# Patient Record
Sex: Male | Born: 1935 | Race: White | Hispanic: No | Marital: Married | Smoking: Former smoker
Health system: Southern US, Community
[De-identification: ages and names within clinical notes are randomized; demographics above are authoritative.]

## PROBLEM LIST (undated history)

## (undated) DIAGNOSIS — K449 Diaphragmatic hernia without obstruction or gangrene: Secondary | ICD-10-CM

## (undated) DIAGNOSIS — I10 Essential (primary) hypertension: Secondary | ICD-10-CM

## (undated) HISTORY — PX: APPENDECTOMY: SHX54

## (undated) HISTORY — PX: HERNIA REPAIR: SHX51

---

## 2016-11-05 ENCOUNTER — Emergency Department (HOSPITAL_COMMUNITY)
Admission: EM | Admit: 2016-11-05 | Discharge: 2016-11-05 | Disposition: A | Discharge status: Home / Self Care | Payer: Medicare HMO | Attending: Emergency Medicine | Admitting: Emergency Medicine

## 2016-11-05 ENCOUNTER — Encounter (HOSPITAL_COMMUNITY): Payer: Self-pay

## 2016-11-05 ENCOUNTER — Emergency Department (HOSPITAL_COMMUNITY): Payer: Medicare HMO

## 2016-11-05 DIAGNOSIS — S065X0D Traumatic subdural hemorrhage without loss of consciousness, subsequent encounter: Secondary | ICD-10-CM | POA: Insufficient documentation

## 2016-11-05 DIAGNOSIS — Z87891 Personal history of nicotine dependence: Secondary | ICD-10-CM | POA: Insufficient documentation

## 2016-11-05 DIAGNOSIS — R7989 Other specified abnormal findings of blood chemistry: Secondary | ICD-10-CM

## 2016-11-05 DIAGNOSIS — R519 Headache, unspecified: Secondary | ICD-10-CM

## 2016-11-05 DIAGNOSIS — R110 Nausea: Secondary | ICD-10-CM | POA: Diagnosis not present

## 2016-11-05 DIAGNOSIS — S065X9A Traumatic subdural hemorrhage with loss of consciousness of unspecified duration, initial encounter: Secondary | ICD-10-CM

## 2016-11-05 DIAGNOSIS — I10 Essential (primary) hypertension: Secondary | ICD-10-CM | POA: Insufficient documentation

## 2016-11-05 DIAGNOSIS — R51 Headache: Secondary | ICD-10-CM | POA: Insufficient documentation

## 2016-11-05 DIAGNOSIS — W19XXXD Unspecified fall, subsequent encounter: Secondary | ICD-10-CM | POA: Insufficient documentation

## 2016-11-05 DIAGNOSIS — S065XAA Traumatic subdural hemorrhage with loss of consciousness status unknown, initial encounter: Secondary | ICD-10-CM

## 2016-11-05 HISTORY — DX: Diaphragmatic hernia without obstruction or gangrene: K44.9

## 2016-11-05 HISTORY — DX: Essential (primary) hypertension: I10

## 2016-11-05 LAB — CBC
HEMATOCRIT: 41.3 % (ref 39.0–52.0)
HEMOGLOBIN: 13.6 g/dL (ref 13.0–17.0)
MCH: 29.7 pg (ref 26.0–34.0)
MCHC: 32.9 g/dL (ref 30.0–36.0)
MCV: 90.2 fL (ref 78.0–100.0)
Platelets: 191 10*3/uL (ref 150–400)
RBC: 4.58 MIL/uL (ref 4.22–5.81)
RDW: 13.5 % (ref 11.5–15.5)
WBC: 9 10*3/uL (ref 4.0–10.5)

## 2016-11-05 LAB — BASIC METABOLIC PANEL
Anion gap: 9 (ref 5–15)
BUN: 17 mg/dL (ref 6–20)
CALCIUM: 8.6 mg/dL — AB (ref 8.9–10.3)
CO2: 24 mmol/L (ref 22–32)
CREATININE: 1.59 mg/dL — AB (ref 0.61–1.24)
Chloride: 103 mmol/L (ref 101–111)
GFR calc Af Amer: 46 mL/min — ABNORMAL LOW (ref >60)
GFR calc non Af Amer: 40 mL/min — ABNORMAL LOW (ref >60)
Glucose, Bld: 158 mg/dL — ABNORMAL HIGH (ref 65–99)
Potassium: 3.8 mmol/L (ref 3.5–5.1)
SODIUM: 136 mmol/L (ref 135–145)

## 2016-11-05 MED ORDER — KETOROLAC TROMETHAMINE 30 MG/ML IJ SOLN: 15.0000 mg | Freq: Once | INTRAMUSCULAR | Status: DC

## 2016-11-05 MED ORDER — HYDROMORPHONE HCL 1 MG/ML IJ SOLN
1.0000 mg | Freq: Once | INTRAMUSCULAR | Status: AC
  Administered 2016-11-05: 1 mg via INTRAMUSCULAR
  Filled 2016-11-05: qty 1

## 2016-11-05 MED ORDER — PROMETHAZINE HCL 25 MG/ML IJ SOLN
12.5000 mg | Freq: Once | INTRAMUSCULAR | Status: DC
  Filled 2016-11-05: qty 1

## 2016-11-05 MED ORDER — ACETAMINOPHEN 325 MG PO TABS
650.0000 mg | ORAL_TABLET | Freq: Once | ORAL | Status: AC
  Administered 2016-11-05: 650 mg via ORAL
  Filled 2016-11-05: qty 2

## 2016-11-05 MED ORDER — ONDANSETRON HCL 4 MG PO TABS
8.0000 mg | ORAL_TABLET | Freq: Once | ORAL | Status: AC
  Administered 2016-11-05: 8 mg via ORAL
  Filled 2016-11-05: qty 2

## 2016-11-05 MED ORDER — MORPHINE SULFATE (PF) 4 MG/ML IV SOLN
2.0000 mg | Freq: Once | INTRAVENOUS | Status: DC
  Filled 2016-11-05: qty 1

## 2016-11-05 MED ORDER — PROMETHAZINE HCL 25 MG PO TABS
25.0000 mg | ORAL_TABLET | Freq: Once | ORAL | Status: AC
  Administered 2016-11-05: 25 mg via ORAL
  Filled 2016-11-05: qty 1

## 2016-11-05 NOTE — Discharge Instructions (Signed)
Do not take any more of Vicodin tonight.  The Vicodin may be causing your severe nausea Continue Zofran as needed for nausea Drink plenty of fluids. Your creatinine is elevated and will need to be rechecked this week. [facility]

## 2016-11-05 NOTE — ED Triage Notes (Signed)
Pt arrived via [facility] from home with complaints of HA since falling Wednesday. Pt was diagnosed with bleed and sent to [facility]. Pt was d/c yesterday with oxycodone, Zofran, and Keppra . Pt reports he is still nauseated and having a headache.

## 2016-11-05 NOTE — ED Notes (Signed)
Patient denies pain and is resting comfortably.  

## 2016-11-05 NOTE — ED Provider Notes (Signed)
[facility] EMERGENCY DEPARTMENT Provider Note   CSN: [id number] Arrival date & time: 11/05/16  1837     History   Chief Complaint Chief Complaint  Patient presents with  . Headache    HPI Kurt Morales is a 81 y.o. male.  HPI  81 year old man who fell on Wednesday while working.  He struck his head.  He was seen at [facility] and [facility].  He was discharged yesterday.  He has continued to have headache and nausea since discharge.  He is taking Percocet, Zofran, and Keppra.  He denies any increased weakness, problems with balance, with vision, or with speech.  He also complains of some neck pain but no numbness or tingling  Past Medical History:  Diagnosis Date  . Hiatal hernia   . Hypertension     There are no active problems to display for this patient.   Past Surgical History:  Procedure Laterality Date  . APPENDECTOMY     when he was 15   . HERNIA REPAIR         Home Medications    Prior to Admission medications   Not on File    Family History No family history on file.  Social History Social History  Substance Use Topics  . Smoking status: Former Games developer  . Smokeless tobacco: Never Used  . Alcohol use No     Allergies   Patient has no allergy information on record.   Review of Systems Review of Systems  All other systems reviewed and are negative.    Physical Exam Updated Vital Signs BP (!) 142/70   Pulse 69   Temp 98.2 F (36.8 C) (Oral)   Resp 12   Ht 1.727 m (5\' 8" )   Wt 74.8 kg (165 lb)   SpO2 93%   BMI 25.09 kg/m   Physical Exam  Constitutional: He is oriented to person, place, and time. He appears well-developed.  HENT:  Head: Normocephalic and atraumatic.  Right Ear: External ear normal.  Left Ear: External ear normal.  Nose: Nose normal.  Contusion noted occiput  Eyes: EOM are normal.  Neck: Normal range of motion. Neck supple. No tracheal  deviation present.  Cardiovascular: Normal rate, regular rhythm and normal heart sounds.   Pulmonary/Chest: Effort normal and breath sounds normal.  Abdominal: Soft. Bowel sounds are normal.  Musculoskeletal: Normal range of motion.  Neurological: He is alert and oriented to person, place, and time. He displays normal reflexes. No cranial nerve deficit or sensory deficit. He exhibits normal muscle tone. Coordination normal.  Skin: Skin is warm and dry.  Psychiatric: He has a normal mood and affect. His behavior is normal.  Nursing note and vitals reviewed.    ED Treatments / Results  Labs (all labs ordered are listed, but only abnormal results are displayed) Labs Reviewed  BASIC METABOLIC PANEL - Abnormal; Notable for the following:       Result Value   Glucose, Bld 158 (*)    Creatinine, Ser 1.59 (*)    Calcium 8.6 (*)    GFR calc non Af Amer 40 (*)    GFR calc Af Amer 46 (*)    All other components within normal limits  CBC    EKG  EKG Interpretation None       Radiology Ct Head Wo Contrast  Result Date: 11/05/2016 CLINICAL DATA:  Headache since falling on Wednesday when patient had intracranial hemorrhage. Discharge from [facility]  yesterday. Nausea and headache. EXAM: CT HEAD WITHOUT CONTRAST CT CERVICAL SPINE WITHOUT CONTRAST TECHNIQUE: Multidetector CT imaging of the head and cervical spine was performed following the standard protocol without intravenous contrast. Multiplanar CT image reconstructions of the cervical spine were also generated. COMPARISON:  Head [facility] 09/02/2016 FINDINGS: CT HEAD FINDINGS Brain: Bilateral inferior frontal hemorrhagic contusions. As expected, edema increased from comparison study but hemorrhage is nonprogressive. Bilateral subdural hematomas along the frontal convexities, stable to decreased from prior. Left-sided subdural hematoma measures up to 5 mm. No subdural mass-effect. Possible subarachnoid hemorrhage  along the mid falx, stable. Small volume anterior left frontal subarachnoid hemorrhage was also seen previously. Small volume right posterior frontal subarachnoid hemorrhage that is newly seen. Probable contrecoup nonhemorrhagic contusion in the left occipital pole. No infarct, hydrocephalus, or shift. Vascular: No acute finding Skull: Known frontal bone fracture and diastatic upper sagittal suture. No depressed fracture. Sinuses/Orbits: No acute finding.  Bilateral cataract resection. CT CERVICAL SPINE FINDINGS Alignment: No traumatic malalignment. Skull base and vertebrae: Negative for fracture Soft tissues and spinal canal: No prevertebral fluid or swelling. No visible canal hematoma. Disc levels:  No evidence of cord impingement Upper chest: No acute finding IMPRESSION: 1. Known bilateral inferior frontal hemorrhagic contusion. As expected, swelling is increased from 11/02/2016, but no progressive hemorrhage. 2. Bifrontal subdural hematoma that is stable to decreased. No associated mass effect. 3. Interval trace subarachnoid hemorrhage along the right parietal convexity. If no interval injury this may reflect re- distributed blood. 4. Suspect mild nonhemorrhagic contrecoup contusion in the left occipital pole. 5. Known nondepressed frontal bone fracture. Sagittal suture diastasis. 6. No cervical spine fracture. Electronically Signed   By: Marnee SpringJonathon  Watts M.D.   On: 11/05/2016 19:53   Ct Cervical Spine Wo Contrast  Result Date: 11/05/2016 CLINICAL DATA:  Headache since falling on Wednesday when patient had intracranial hemorrhage. [facility]. Nausea and headache. EXAM: CT HEAD WITHOUT CONTRAST CT CERVICAL SPINE WITHOUT CONTRAST TECHNIQUE: Multidetector CT imaging of the head and cervical spine was performed following the standard protocol without intravenous contrast. Multiplanar CT image reconstructions of the cervical spine were also generated. COMPARISON:  [facility]02/2016 FINDINGS: CT HEAD FINDINGS Brain: Bilateral inferior frontal hemorrhagic contusions. As expected, edema increased from comparison study but hemorrhage is nonprogressive. Bilateral subdural hematomas along the frontal convexities, stable to decreased from prior. Left-sided subdural hematoma measures up to 5 mm. No subdural mass-effect. Possible subarachnoid hemorrhage along the mid falx, stable. Small volume anterior left frontal subarachnoid hemorrhage was also seen previously. Small volume right posterior frontal subarachnoid hemorrhage that is newly seen. Probable contrecoup nonhemorrhagic contusion in the left occipital pole. No infarct, hydrocephalus, or shift. Vascular: No acute finding Skull: Known frontal bone fracture and diastatic upper sagittal suture. No depressed fracture. Sinuses/Orbits: No acute finding.  Bilateral cataract resection. CT CERVICAL SPINE FINDINGS Alignment: No traumatic malalignment. Skull base and vertebrae: Negative for fracture Soft tissues and spinal canal: No prevertebral fluid or swelling. No visible canal hematoma. Disc levels:  No evidence of cord impingement Upper chest: No acute finding IMPRESSION: 1. Known bilateral inferior frontal hemorrhagic contusion. As expected, swelling is increased from 11/02/2016, but no progressive hemorrhage. 2. Bifrontal subdural hematoma that is stable to decreased. No associated mass effect. 3. Interval trace subarachnoid hemorrhage along the right parietal convexity. If no interval injury this may reflect re- distributed blood. 4. Suspect mild nonhemorrhagic contrecoup contusion in the left occipital  pole. 5. Known nondepressed frontal bone fracture. Sagittal suture diastasis. 6. No cervical spine fracture. Electronically Signed   By: Marnee Spring M.D.   On: 11/05/2016 19:53    Procedures Procedures (including critical care time)  Medications Ordered in ED Medications  morphine 4 MG/ML injection 2 mg (2 mg  Intravenous Not Given 11/05/16 2029)  promethazine (PHENERGAN) injection 12.5 mg (12.5 mg Intravenous Given 11/05/16 2028)  acetaminophen (TYLENOL) tablet 650 mg (650 mg Oral Given 11/05/16 2028)     Initial Impression / Assessment and Plan / ED Course  I have reviewed the triage vital signs and the nursing notes.  Pertinent labs & imaging results that were available during my care of the patient were reviewed by me and considered in my medical decision making (see chart for details).    Repeat head CT and cervical spine CT here reveals no evidence of cervical spine fracture.  Head CT shows stable subdural hematoma compared to CT performed 11/02/2016 at [facility]. Patient's injuries appear stable.  Here his worst symptom appears to be nausea.  He is given Phenergan and Tylenol. Patient severe nausea could be coming from his Vicodin.  He is given a dose of Phenergan here.  He is not actively vomiting and has had a liter of gait.  Review of his labs reveal an elevated creatinine at 1.5, however I have no old ones here to compare to.  Family tells me that he has some known kidney damage.  Plan Home, hold Vicodin, continue anti-emetics and push fluids.  They are being called tomorrow by the neurosurgical clinic for follow-up.  We discussed that he will need to have his creatinine rechecked.  We also discussed that he can restart the Vicodin if he continues to have a severe headache and the nausea does not improve after holding it overnight.  In the interim, he will use Tylenol for pain.  Discussed need for follow-up and return precautions and patient, wife, and son are in agreement with plan and voiced understanding Final Clinical Impressions(s) / ED Diagnoses   Final diagnoses:  Nonintractable headache, unspecified chronicity pattern, unspecified headache type  Subdural hematoma (HCC)  Nausea  Elevated serum creatinine    New Prescriptions New Prescriptions   No medications  on file     Margarita Grizzle, MD 11/05/16 2052

## 2016-11-07 ENCOUNTER — Emergency Department (HOSPITAL_COMMUNITY): Payer: Medicare HMO

## 2016-11-07 ENCOUNTER — Encounter (HOSPITAL_COMMUNITY): Payer: Self-pay | Admitting: Emergency Medicine

## 2016-11-07 ENCOUNTER — Emergency Department (HOSPITAL_COMMUNITY)
Admission: EM | Admit: 2016-11-07 | Discharge: 2016-11-07 | Disposition: A | Discharge status: Home / Self Care | Payer: Medicare HMO | Attending: Emergency Medicine | Admitting: Emergency Medicine

## 2016-11-07 DIAGNOSIS — I10 Essential (primary) hypertension: Secondary | ICD-10-CM | POA: Diagnosis not present

## 2016-11-07 DIAGNOSIS — I6200 Nontraumatic subdural hemorrhage, unspecified: Secondary | ICD-10-CM | POA: Diagnosis not present

## 2016-11-07 DIAGNOSIS — Z87891 Personal history of nicotine dependence: Secondary | ICD-10-CM | POA: Insufficient documentation

## 2016-11-07 DIAGNOSIS — R51 Headache: Secondary | ICD-10-CM | POA: Diagnosis present

## 2016-11-07 DIAGNOSIS — W19XXXD Unspecified fall, subsequent encounter: Secondary | ICD-10-CM | POA: Insufficient documentation

## 2016-11-07 LAB — CBC WITH DIFFERENTIAL/PLATELET
Basophils Absolute: 0 10*3/uL (ref 0.0–0.1)
Basophils Relative: 0 %
EOS PCT: 0 %
Eosinophils Absolute: 0 10*3/uL (ref 0.0–0.7)
HCT: 38.1 % — ABNORMAL LOW (ref 39.0–52.0)
Hemoglobin: 12.6 g/dL — ABNORMAL LOW (ref 13.0–17.0)
LYMPHS ABS: 0.7 10*3/uL (ref 0.7–4.0)
LYMPHS PCT: 9 %
MCH: 29.2 pg (ref 26.0–34.0)
MCHC: 33.1 g/dL (ref 30.0–36.0)
MCV: 88.2 fL (ref 78.0–100.0)
MONO ABS: 0.6 10*3/uL (ref 0.1–1.0)
MONOS PCT: 7 %
Neutro Abs: 6.9 10*3/uL (ref 1.7–7.7)
Neutrophils Relative %: 84 %
PLATELETS: 196 10*3/uL (ref 150–400)
RBC: 4.32 MIL/uL (ref 4.22–5.81)
RDW: 13.6 % (ref 11.5–15.5)
WBC: 8.3 10*3/uL (ref 4.0–10.5)

## 2016-11-07 LAB — COMPREHENSIVE METABOLIC PANEL
ALT: 30 U/L (ref 17–63)
ANION GAP: 9 (ref 5–15)
AST: 33 U/L (ref 15–41)
Albumin: 3.1 g/dL — ABNORMAL LOW (ref 3.5–5.0)
Alkaline Phosphatase: 182 U/L — ABNORMAL HIGH (ref 38–126)
BUN: 18 mg/dL (ref 6–20)
CHLORIDE: 101 mmol/L (ref 101–111)
CO2: 22 mmol/L (ref 22–32)
CREATININE: 1.6 mg/dL — AB (ref 0.61–1.24)
Calcium: 8.4 mg/dL — ABNORMAL LOW (ref 8.9–10.3)
GFR, EST AFRICAN AMERICAN: 46 mL/min — AB (ref >60)
GFR, EST NON AFRICAN AMERICAN: 39 mL/min — AB (ref >60)
Glucose, Bld: 133 mg/dL — ABNORMAL HIGH (ref 65–99)
Potassium: 3.6 mmol/L (ref 3.5–5.1)
Sodium: 132 mmol/L — ABNORMAL LOW (ref 135–145)
Total Bilirubin: 1.2 mg/dL (ref 0.3–1.2)
Total Protein: 6.6 g/dL (ref 6.5–8.1)

## 2016-11-07 MED ORDER — ONDANSETRON HCL 4 MG/2ML IJ SOLN: 4.0000 mg | Freq: Once | INTRAMUSCULAR | Status: DC

## 2016-11-07 MED ORDER — SODIUM CHLORIDE 0.9 % IV BOLUS (SEPSIS)
1000.0000 mL | Freq: Once | INTRAVENOUS | Status: DC
Start: 2016-11-07 — End: 2016-11-07

## 2016-11-07 MED ORDER — OXYCODONE-ACETAMINOPHEN 5-325 MG PO TABS
2.0000 | ORAL_TABLET | Freq: Once | ORAL | Status: AC
  Administered 2016-11-07: 2 via ORAL
  Filled 2016-11-07: qty 2

## 2016-11-07 MED ORDER — MORPHINE SULFATE (PF) 4 MG/ML IV SOLN: 4.0000 mg | Freq: Once | INTRAVENOUS | Status: DC

## 2016-11-07 MED ORDER — ONDANSETRON 4 MG PO TBDP
4.0000 mg | ORAL_TABLET | Freq: Once | ORAL | Status: AC
  Administered 2016-11-07: 4 mg via ORAL
  Filled 2016-11-07: qty 1

## 2016-11-07 NOTE — Discharge Instructions (Signed)
Take zofran for nausea.   Continue vicodin every 6 hrs for pain.   See your doctor. Follow up neurosurgeon   Return to ER if you have worse headaches, nausea, vomiting, confusion, lethargy.

## 2016-11-07 NOTE — ED Triage Notes (Signed)
Pt arrives via [facility] from home, ems reports pt had a fall on Thursday, went to [facility], diagnosed with skull fracture and subdural, d/c on sat, seen here on sun for continued pain, pt here today for continued pain, [facility] states pts family member has been holding pts norco. A/ox4, nad.

## 2016-11-07 NOTE — ED Provider Notes (Signed)
[facility] EMERGENCY DEPARTMENT Provider Note   CSN: [id number] Arrival date & time: 11/07/16  1026     History   Chief Complaint Chief Complaint  Patient presents with  . Headache    HPI Kurt Morales is a 81 y.o. male history of hypertension here presenting with headaches.  Patient fell face forward about 6 days ago and went to [facility] and had a CT that showed subdural hemorrhage and was transferred to [facility].  Patient then was admitted and discharged 3 days ago on Zofran, Vicodin.  2 days ago, patient was seen back in the ED for persistent headaches and CT was stable at that time.  Wife has not been giving him the hydrocodone and he had persistent headaches and nausea today so he was brought in for further evaluation.  Denies any vomiting and denies any confusion. Denies any further falls.    The history is provided by the patient.    Past Medical History:  Diagnosis Date  . Hiatal hernia   . Hypertension     There are no active problems to display for this patient.   Past Surgical History:  Procedure Laterality Date  . APPENDECTOMY     when he was 15   . HERNIA REPAIR         Home Medications    Prior to Admission medications   Not on File    Family History No family history on file.  Social History Social History  Substance Use Topics  . Smoking status: Former Games developer  . Smokeless tobacco: Never Used  . Alcohol use No     Allergies   Patient has no known allergies.   Review of Systems Review of Systems  Neurological: Positive for headaches.  All other systems reviewed and are negative.    Physical Exam Updated Vital Signs BP (!) 147/63 (BP Location: Left Arm)   Pulse 64   Temp 98.4 F (36.9 C) (Oral)   Resp 16   SpO2 92%   Physical Exam  Constitutional: He is oriented to person, place, and time.  Chronically ill, slightly uncomfortable   HENT:  Head: Normocephalic.  Eyes: Pupils are equal,  round, and reactive to light. Conjunctivae and EOM are normal.  Neck: Normal range of motion. Neck supple.  Cardiovascular: Normal rate, regular rhythm and normal heart sounds.   Pulmonary/Chest: Effort normal and breath sounds normal. No respiratory distress. He has no wheezes.  Abdominal: Soft. Bowel sounds are normal. He exhibits no distension. There is no tenderness.  Musculoskeletal: Normal range of motion.  Neurological: He is alert and oriented to person, place, and time. No cranial nerve deficit.  CN 2- 12 intact, nl strength throughout, nl finger to nose bilaterally, nl sensation throughout   Skin: Skin is warm.  Psychiatric: He has a normal mood and affect.  Nursing note and vitals reviewed.    ED Treatments / Results  Labs (all labs ordered are listed, but only abnormal results are displayed) Labs Reviewed  CBC WITH DIFFERENTIAL/PLATELET - Abnormal; Notable for the following:       Result Value   Hemoglobin 12.6 (*)    HCT 38.1 (*)    All other components within normal limits  COMPREHENSIVE METABOLIC PANEL - Abnormal; Notable for the following:    Sodium 132 (*)    Glucose, Bld 133 (*)    Creatinine, Ser 1.60 (*)    Calcium 8.4 (*)    Albumin 3.1 (*)  Alkaline Phosphatase 182 (*)    GFR calc non Af Amer 39 (*)    GFR calc Af Amer 46 (*)    All other components within normal limits    EKG  EKG Interpretation None       Radiology Ct Head Wo Contrast  Result Date: 11/07/2016 CLINICAL DATA:  Fall several days ago with persistent dizziness, initial encounter EXAM: CT HEAD WITHOUT CONTRAST TECHNIQUE: Contiguous axial images were obtained from the base of the skull through the vertex without intravenous contrast. COMPARISON:  11/05/2016 FINDINGS: Brain: Mild atrophic changes are noted. The known bifrontal contusions are again seen and stable in appearance. No significant increase in the degree of parenchymal hemorrhage or edema is noted. The previously seen  subdural along the left frontal convexity is again identified and stable. No acute subdural or subarachnoid hemorrhage is identified. No acute infarct is seen. Vascular: No hyperdense vessel or unexpected calcification. Skull: The frontal bone fracture seen on the prior exam is again visualized and stable. No new fracture is identified. Sinuses/Orbits: No acute finding. Other: None. IMPRESSION: Stable bifrontal contusions. No significant increase in the degree of hemorrhage or edema is noted. Stable left frontal subdural hematoma and frontal bone fracture. No acute hemorrhage is identified. Electronically Signed   By: Alcide Clever M.D.   On: 11/07/2016 11:02   Ct Head Wo Contrast  Addendum Date: 11/05/2016   ADDENDUM REPORT: 11/05/2016 20:44 ADDENDUM: Please note that the comparison head CT was performed 11/02/2016 (as opposed to 09/02/2016 as stated in the comparison section). Electronically Signed   By: Marnee Spring M.D.   On: 11/05/2016 20:44   Result Date: 11/05/2016 CLINICAL DATA:  Headache since falling on Wednesday when patient had intracranial hemorrhage. Discharge from [facility] yesterday. Nausea and headache. EXAM: CT HEAD WITHOUT CONTRAST CT CERVICAL SPINE WITHOUT CONTRAST TECHNIQUE: Multidetector CT imaging of the head and cervical spine was performed following the standard protocol without intravenous contrast. Multiplanar CT image reconstructions of the cervical spine were also generated. COMPARISON:  Head CT from [facility] 09/02/2016 FINDINGS: CT HEAD FINDINGS Brain: Bilateral inferior frontal hemorrhagic contusions. As expected, edema increased from comparison study but hemorrhage is nonprogressive. Bilateral subdural hematomas along the frontal convexities, stable to decreased from prior. Left-sided subdural hematoma measures up to 5 mm. No subdural mass-effect. Possible subarachnoid hemorrhage along the mid falx, stable. Small volume anterior left frontal subarachnoid  hemorrhage was also seen previously. Small volume right posterior frontal subarachnoid hemorrhage that is newly seen. Probable contrecoup nonhemorrhagic contusion in the left occipital pole. No infarct, hydrocephalus, or shift. Vascular: No acute finding Skull: Known frontal bone fracture and diastatic upper sagittal suture. No depressed fracture. Sinuses/Orbits: No acute finding.  Bilateral cataract resection. CT CERVICAL SPINE FINDINGS Alignment: No traumatic malalignment. Skull base and vertebrae: Negative for fracture Soft tissues and spinal canal: No prevertebral fluid or swelling. No visible canal hematoma. Disc levels:  No evidence of cord impingement Upper chest: No acute finding IMPRESSION: 1. Known bilateral inferior frontal hemorrhagic contusion. As expected, swelling is increased from 11/02/2016, but no progressive hemorrhage. 2. Bifrontal subdural hematoma that is stable to decreased. No associated mass effect. 3. Interval trace subarachnoid hemorrhage along the right parietal convexity. If no interval injury this may reflect re- distributed blood. 4. Suspect mild nonhemorrhagic contrecoup contusion in the left occipital pole. 5. Known nondepressed frontal bone fracture. Sagittal suture diastasis. 6. No cervical spine fracture. Electronically Signed: By: Marnee Spring M.D. On: 11/05/2016 19:53  Ct Cervical Spine Wo Contrast  Addendum Date: 11/05/2016   ADDENDUM REPORT: 11/05/2016 20:44 ADDENDUM: Please note that the comparison head CT was performed 11/02/2016 (as opposed to 09/02/2016 as stated in the comparison section). Electronically Signed   By: Marnee SpringJonathon  Watts M.D.   On: 11/05/2016 20:44   Result Date: 11/05/2016 CLINICAL DATA:  Headache since falling on Wednesday when patient had intracranial hemorrhage. [facility]. Nausea and headache. EXAM: CT HEAD WITHOUT CONTRAST CT CERVICAL SPINE WITHOUT CONTRAST TECHNIQUE: Multidetector CT imaging of the head and cervical  spine was performed following the standard protocol without intravenous contrast. Multiplanar CT image reconstructions of the cervical spine were also generated. COMPARISON:  Head [facility]/2016 FINDINGS: CT HEAD FINDINGS Brain: Bilateral inferior frontal hemorrhagic contusions. As expected, edema increased from comparison study but hemorrhage is nonprogressive. Bilateral subdural hematomas along the frontal convexities, stable to decreased from prior. Left-sided subdural hematoma measures up to 5 mm. No subdural mass-effect. Possible subarachnoid hemorrhage along the mid falx, stable. Small volume anterior left frontal subarachnoid hemorrhage was also seen previously. Small volume right posterior frontal subarachnoid hemorrhage that is newly seen. Probable contrecoup nonhemorrhagic contusion in the left occipital pole. No infarct, hydrocephalus, or shift. Vascular: No acute finding Skull: Known frontal bone fracture and diastatic upper sagittal suture. No depressed fracture. Sinuses/Orbits: No acute finding.  Bilateral cataract resection. CT CERVICAL SPINE FINDINGS Alignment: No traumatic malalignment. Skull base and vertebrae: Negative for fracture Soft tissues and spinal canal: No prevertebral fluid or swelling. No visible canal hematoma. Disc levels:  No evidence of cord impingement Upper chest: No acute finding IMPRESSION: 1. Known bilateral inferior frontal hemorrhagic contusion. As expected, swelling is increased from 11/02/2016, but no progressive hemorrhage. 2. Bifrontal subdural hematoma that is stable to decreased. No associated mass effect. 3. Interval trace subarachnoid hemorrhage along the right parietal convexity. If no interval injury this may reflect re- distributed blood. 4. Suspect mild nonhemorrhagic contrecoup contusion in the left occipital pole. 5. Known nondepressed frontal bone fracture. Sagittal suture diastasis. 6. No cervical spine fracture. Electronically  Signed: By: Marnee SpringJonathon  Watts M.D. On: 11/05/2016 19:53    Procedures Procedures (including critical care time)  Medications Ordered in ED Medications  ondansetron (ZOFRAN-ODT) disintegrating tablet 4 mg (4 mg Oral Given 11/07/16 1150)  oxyCODONE-acetaminophen (PERCOCET/ROXICET) 5-325 MG per tablet 2 tablet (2 tablets Oral Given 11/07/16 1150)     Initial Impression / Assessment and Plan / ED Course  I have reviewed the triage vital signs and the nursing notes.  Pertinent labs & imaging results that were available during my care of the patient were reviewed by me and considered in my medical decision making (see chart for details).     Kurt FetchWilliam Morales is a 81 y.o. male here with persistent headaches. Nl neuro exam currently, will repeat CT head, labs. Hasn't been taking his hydrocodone.   12:16 PM CT head showed improved hematoma. Headache improved with pain meds. No vomiting in the Ed. Has vicodin and zofran at home. Has neurosurgery follow up. Will dc home.    Final Clinical Impressions(s) / ED Diagnoses   Final diagnoses:  None    New Prescriptions New Prescriptions   No medications on file     Charlynne PanderYao, David Hsienta, MD 11/07/16 1216

## 2016-11-07 NOTE — ED Notes (Signed)
Patient transported to CT 

## 2016-11-07 NOTE — ED Notes (Signed)
Patient verbalizes understanding of discharge instructions. Opportunity for questioning and answers were provided. 

## 2016-11-10 ENCOUNTER — Emergency Department (HOSPITAL_COMMUNITY)
Admission: EM | Admit: 2016-11-10 | Discharge: 2016-11-10 | Disposition: A | Discharge status: Home / Self Care | Payer: Medicare HMO | Attending: Emergency Medicine | Admitting: Emergency Medicine

## 2016-11-10 ENCOUNTER — Encounter (HOSPITAL_COMMUNITY): Payer: Self-pay | Admitting: Emergency Medicine

## 2016-11-10 ENCOUNTER — Emergency Department (HOSPITAL_COMMUNITY): Payer: Medicare HMO

## 2016-11-10 DIAGNOSIS — I10 Essential (primary) hypertension: Secondary | ICD-10-CM | POA: Diagnosis not present

## 2016-11-10 DIAGNOSIS — Z87891 Personal history of nicotine dependence: Secondary | ICD-10-CM | POA: Diagnosis not present

## 2016-11-10 DIAGNOSIS — R51 Headache: Secondary | ICD-10-CM | POA: Diagnosis present

## 2016-11-10 DIAGNOSIS — W010XXA Fall on same level from slipping, tripping and stumbling without subsequent striking against object, initial encounter: Secondary | ICD-10-CM | POA: Diagnosis not present

## 2016-11-10 DIAGNOSIS — G44319 Acute post-traumatic headache, not intractable: Secondary | ICD-10-CM | POA: Insufficient documentation

## 2016-11-10 DIAGNOSIS — F0781 Postconcussional syndrome: Secondary | ICD-10-CM | POA: Insufficient documentation

## 2016-11-10 LAB — COMPREHENSIVE METABOLIC PANEL
ALBUMIN: 3.3 g/dL — AB (ref 3.5–5.0)
ALT: 22 U/L (ref 17–63)
ANION GAP: 9 (ref 5–15)
AST: 18 U/L (ref 15–41)
Alkaline Phosphatase: 177 U/L — ABNORMAL HIGH (ref 38–126)
BUN: 24 mg/dL — ABNORMAL HIGH (ref 6–20)
CHLORIDE: 97 mmol/L — AB (ref 101–111)
CO2: 23 mmol/L (ref 22–32)
Calcium: 9.1 mg/dL (ref 8.9–10.3)
Creatinine, Ser: 1.62 mg/dL — ABNORMAL HIGH (ref 0.61–1.24)
GFR calc non Af Amer: 39 mL/min — ABNORMAL LOW (ref >60)
GFR, EST AFRICAN AMERICAN: 45 mL/min — AB (ref >60)
GLUCOSE: 170 mg/dL — AB (ref 65–99)
Potassium: 4 mmol/L (ref 3.5–5.1)
Sodium: 129 mmol/L — ABNORMAL LOW (ref 135–145)
Total Bilirubin: 0.8 mg/dL (ref 0.3–1.2)
Total Protein: 7.4 g/dL (ref 6.5–8.1)

## 2016-11-10 LAB — CBC
HCT: 41.5 % (ref 39.0–52.0)
HEMOGLOBIN: 13.9 g/dL (ref 13.0–17.0)
MCH: 29.6 pg (ref 26.0–34.0)
MCHC: 33.5 g/dL (ref 30.0–36.0)
MCV: 88.3 fL (ref 78.0–100.0)
Platelets: 272 10*3/uL (ref 150–400)
RBC: 4.7 MIL/uL (ref 4.22–5.81)
RDW: 13.3 % (ref 11.5–15.5)
WBC: 8.3 10*3/uL (ref 4.0–10.5)

## 2016-11-10 MED ORDER — ONDANSETRON 8 MG PO TBDP: 8.0000 mg | ORAL_TABLET | Freq: Three times a day (TID) | ORAL | 0 refills | Status: DC | PRN

## 2016-11-10 MED ORDER — ONDANSETRON 4 MG PO TBDP
ORAL_TABLET | ORAL | Status: AC
  Filled 2016-11-10: qty 1

## 2016-11-10 MED ORDER — OXYCODONE-ACETAMINOPHEN 5-325 MG PO TABS
1.0000 | ORAL_TABLET | Freq: Once | ORAL | Status: AC
  Administered 2016-11-10: 1 via ORAL
  Filled 2016-11-10: qty 1

## 2016-11-10 MED ORDER — ONDANSETRON 4 MG PO TBDP
4.0000 mg | ORAL_TABLET | Freq: Once | ORAL | Status: AC
  Administered 2016-11-10: 4 mg via ORAL

## 2016-11-10 NOTE — ED Triage Notes (Signed)
Pt to ER for further evaluation of persistent nausea after experiencing a fall with skull fracture and subdural hematoma last week, [facility]. Pt family reports was seen Tuesday here for same and given new medications without any relief however experienced a new fall yesterday without LOC, states has nausea but no vomiting in the last 24 hours. Pt family is concerned because patient is not eating and has kidney disease per family. Pt appears fatigued in triage. Pupils equal and round.

## 2016-11-10 NOTE — ED Provider Notes (Signed)
[facility] EMERGENCY DEPARTMENT Provider Note   CSN: [id number] Arrival date & time: 11/10/16  1244 History   Chief Complaint Chief Complaint  Patient presents with  . Headache   HPI Man Effertz is a 81 y.o. male with PMH of HTN who presents with headache after a fall 1 day.  With [facility] after a fall approximately 1 week ago who was found to have a subdural hematoma and a nondisplaced skull fracture.  Patient's wife states that he was complaining of difficulty sleeping yesterday and that she gave him half a Xanax, patient then proceeded to fall sometime after that.  States that initially she did not think much of it as he was able to get back up without difficulty however today noted that he had a large contusion over the left side of the back of his head, and he was complaining of headache.   The history is provided by the patient and the spouse.  Fall  This is a recurrent problem. The current episode started yesterday. The problem has been gradually improving. Associated symptoms include headaches. Pertinent negatives include no chest pain, no abdominal pain and no shortness of breath. Nothing aggravates the symptoms. Nothing relieves the symptoms.    Past Medical History:  Diagnosis Date  . Hiatal hernia   . Hypertension    There are no active problems to display for this patient.  Past Surgical History:  Procedure Laterality Date  . APPENDECTOMY     when he was 15   . HERNIA REPAIR      Home Medications    Prior to Admission medications   Not on File   Family History History reviewed. No pertinent family history.  Social History Social History  Substance Use Topics  . Smoking status: Former Games developer  . Smokeless tobacco: Never Used  . Alcohol use No     Allergies   Patient has no known allergies.  Review of Systems Review of Systems  Constitutional: Negative for chills and fever.  HENT: Negative for ear pain and  sore throat.   Eyes: Negative for pain and visual disturbance.  Respiratory: Negative for cough and shortness of breath.   Cardiovascular: Negative for chest pain and palpitations.  Gastrointestinal: Negative for abdominal pain and vomiting.  Genitourinary: Negative for dysuria and hematuria.  Musculoskeletal: Negative for arthralgias and back pain.  Skin: Negative for color change and rash.  Neurological: Positive for headaches. Negative for seizures and syncope.  All other systems reviewed and are negative.  Physical Exam Updated Vital Signs BP 131/79   Pulse 74   Temp 98.1 F (36.7 C) (Oral)   Resp 14   SpO2 97%   Physical Exam  Constitutional: He appears well-developed and well-nourished.  HENT:  Head: Normocephalic and atraumatic.  Eyes: Pupils are equal, round, and reactive to light. Conjunctivae and EOM are normal.  Neck: Normal range of motion. Neck supple.  Cardiovascular: Normal rate and regular rhythm.   No murmur heard. Pulmonary/Chest: Effort normal and breath sounds normal. No respiratory distress.  Abdominal: Soft. Bowel sounds are normal. He exhibits no distension. There is no tenderness.  Musculoskeletal: Normal range of motion. He exhibits no edema, tenderness or deformity.  Neurological: He is alert. No cranial nerve deficit or sensory deficit. He exhibits normal muscle tone. Coordination normal.  Skin: Skin is warm and dry.  Psychiatric: He has a normal mood and affect.  Nursing note and vitals reviewed.  ED Treatments / Results  Labs (all labs ordered are listed, but only abnormal results are displayed) Labs Reviewed  COMPREHENSIVE METABOLIC PANEL - Abnormal; Notable for the following:       Result Value   Sodium 129 (*)    Chloride 97 (*)    Glucose, Bld 170 (*)    BUN 24 (*)    Creatinine, Ser 1.62 (*)    Albumin 3.3 (*)    Alkaline Phosphatase 177 (*)    GFR calc non Af Amer 39 (*)    GFR calc Af Amer 45 (*)    All other components within  normal limits  CBC   EKG  EKG Interpretation None      Radiology Ct Head Wo Contrast  Result Date: 11/10/2016 CLINICAL DATA:  Recent fall with hemorrhagic contusions in subdural hematoma over the frontal lobes bilaterally. New fall yesterday. EXAM: CT HEAD WITHOUT CONTRAST TECHNIQUE: Contiguous axial images were obtained from the base of the skull through the vertex without intravenous contrast. COMPARISON:  CT head without contrast 11/07/2016 and 11/02/2016. FINDINGS: Brain: There is expected evolution of edema and contusion in the bilateral anterior inferior frontal lobes. No new hemorrhage is present. There is no midline shift. No acute cortical infarct is present. The ventricles are of normal size. No new extra-axial fluid collection is present. Previously noted bilateral frontal subdural hematomas are stable to slightly decreased in size. Vascular: Atherosclerotic calcifications are present at the cavernous internal carotid artery is. No focal hyperdense vessel is present. Skull: All the calvarium is intact. A parasagittal skull fracture is again seen. This extends into the left frontal sinus in involves both the inner and outer tables of the calvarium. No new fractures are evident. Sinuses/Orbits: Mild mucosal thickening is scattered throughout ethmoid air cells. The remaining paranasal sinuses and the mastoid air cells are clear. The patient is status post bilateral lens replacements. The globes and orbits are otherwise unremarkable. IMPRESSION: 1. Expected evolution of the edema and contusion in the anterior frontal lobes bilaterally. 2. Stable slight decrease in left greater than right frontal subdural hematomas. 3. No new hemorrhage or infarct. 4. Stable nondisplaced left paramedian sagittal skull fracture. 5. Mild scattered ethmoid sinus disease. Electronically Signed   By: Marin Roberts M.D.   On: 11/10/2016 14:58   Procedures Procedures (including critical care time)  Medications  Ordered in ED Medications  ondansetron (ZOFRAN-ODT) 4 MG disintegrating tablet (not administered)  ondansetron (ZOFRAN-ODT) disintegrating tablet 4 mg (4 mg Oral Given 11/10/16 1632)   Initial Impression / Assessment and Plan / ED Course  I have reviewed the triage vital signs and the nursing notes.  Pertinent labs & imaging results that were available during my care of the patient were reviewed by me and considered in my medical decision making (see chart for details).  Pt is a 81 y.o. male with pertinent PMHX HTN who presents w/ a headache after a fall.   On my initial assessment patient appears to be resting comfortably, he is hemodynamically stable.  Neuro exam is intact.  The patient does not take anticoagulants.  Will obtain head CT  Basic labs performed.   Labs remarkable for creatine of 1.62 appears stable compared to previous, CBC within normal limits,  CT head: improving SDH, without new hemorrhage or infarct, full report above.   Labs and imaging reviewed by myself and considered in medical decision making if ordered.  Imaging interpreted by radiology.  Lengthy discussion with the patient and his wife at bedside about his symptoms.  Explained to the patient and his wife that headache was most likely due to subdural hematoma, postconcussive headache, and skull fracture in combination.  Patient's wife states that he has been taking Zofran and Norco at home with improvement of his symptoms.  Discouraged wife from giving the patient any further Xanax and encouraged use of walker from here on in efforts to reduce the risk of falling.  Patient and wife are in agreement with the plan.  Advised to follow-up with his primary care physician in approximately 1-2 weeks.  Advised to schedule follow-up with neurology as needed for continued headache.  Patient discharged in stable condition.  The plan for this patient was discussed with Dr. Denton LankSteinl, who voiced agreement and who oversaw  evaluation and treatment of this patient.   Final Clinical Impressions(s) / ED Diagnoses   Final diagnoses:  Acute post-traumatic headache, not intractable  Post concussion syndrome   New Prescriptions New Prescriptions   No medications on file     Lamont SnowballGarza, Kavish Lafitte, MD 11/10/16 1850    Cathren LaineSteinl, Kevin, MD 11/11/16 1550

## 2016-11-10 NOTE — ED Notes (Signed)
Pt reports any way he lays down he is sore. Wife at bedside reports that pt had an additional fall yesterday morning. Pt has hematoma on lower left posterior head.

## 2018-01-03 ENCOUNTER — Encounter (HOSPITAL_COMMUNITY): Payer: Self-pay

## 2018-01-03 ENCOUNTER — Other Ambulatory Visit: Payer: Self-pay

## 2018-01-03 ENCOUNTER — Emergency Department (HOSPITAL_COMMUNITY)
Admission: EM | Admit: 2018-01-03 | Discharge: 2018-01-04 | Disposition: A | Discharge status: Home / Self Care | Payer: Medicare HMO | Attending: Emergency Medicine | Admitting: Emergency Medicine

## 2018-01-03 DIAGNOSIS — Y929 Unspecified place or not applicable: Secondary | ICD-10-CM | POA: Diagnosis not present

## 2018-01-03 DIAGNOSIS — S0001XA Abrasion of scalp, initial encounter: Secondary | ICD-10-CM | POA: Diagnosis not present

## 2018-01-03 DIAGNOSIS — I10 Essential (primary) hypertension: Secondary | ICD-10-CM | POA: Diagnosis not present

## 2018-01-03 DIAGNOSIS — Y999 Unspecified external cause status: Secondary | ICD-10-CM | POA: Diagnosis not present

## 2018-01-03 DIAGNOSIS — Y9389 Activity, other specified: Secondary | ICD-10-CM | POA: Diagnosis not present

## 2018-01-03 DIAGNOSIS — S0003XA Contusion of scalp, initial encounter: Secondary | ICD-10-CM | POA: Insufficient documentation

## 2018-01-03 DIAGNOSIS — S2020XA Contusion of thorax, unspecified, initial encounter: Secondary | ICD-10-CM | POA: Insufficient documentation

## 2018-01-03 DIAGNOSIS — S20211A Contusion of right front wall of thorax, initial encounter: Secondary | ICD-10-CM

## 2018-01-03 DIAGNOSIS — S0990XA Unspecified injury of head, initial encounter: Secondary | ICD-10-CM | POA: Diagnosis present

## 2018-01-03 DIAGNOSIS — W01198A Fall on same level from slipping, tripping and stumbling with subsequent striking against other object, initial encounter: Secondary | ICD-10-CM | POA: Insufficient documentation

## 2018-01-03 DIAGNOSIS — W010XXA Fall on same level from slipping, tripping and stumbling without subsequent striking against object, initial encounter: Secondary | ICD-10-CM

## 2018-01-03 DIAGNOSIS — Z87891 Personal history of nicotine dependence: Secondary | ICD-10-CM | POA: Diagnosis not present

## 2018-01-03 NOTE — ED Triage Notes (Signed)
Pt here for a fall tonight with right sided head injury and right rib cage pain.  No dizziness just trip and fall. A&Ox4 no neuro deficits.

## 2018-01-04 ENCOUNTER — Emergency Department (HOSPITAL_COMMUNITY): Payer: Medicare HMO

## 2018-01-04 NOTE — ED Notes (Signed)
Pt ambulatory to his room.

## 2018-01-04 NOTE — ED Notes (Signed)
Patient verbalizes understanding of discharge instructions. Opportunity for questioning and answers were provided. Armband removed by staff, pt discharged from ED home via POV with family. 

## 2018-01-04 NOTE — Discharge Instructions (Addendum)
Apply ice for 30 minutes, 3-4 times a day.  Move ice back between areas that are hurting.  Take acetaminophen and/or ibuprofen as needed for pain.  If you combine acetaminophen and ibuprofen, you will get better pain relief than with either one by itself.

## 2018-01-04 NOTE — ED Notes (Addendum)
Pt states he was walking about of the rehab facility his wife is staying at and tripped over his own 2 feet. Pt reports the ground was different levels which is the reason he tripped. Pt reports striking his head and right chest wall.

## 2018-01-04 NOTE — ED Provider Notes (Signed)
[facility] Provider Note   CSN: [id number] Arrival date & time: 01/03/18  2326     History   Chief Complaint Chief Complaint  Patient presents with  . Fall    HPI Kurt Morales is a 82 y.o. male.  The history is provided by the patient.  He has history of hypertension and comes in following a fall.  He tripped over some uneven concrete and struck the right side of his head and his right chest on a concrete.  He suffered abrasion to his head and is complaining of pain in his chest.  He states he is up-to-date on tetanus immunizations.  He denies loss of consciousness and is not on any antiplatelet agent or anticoagulant.  He denies dyspnea, nausea, vomiting.  Past Medical History:  Diagnosis Date  . Hiatal hernia   . Hypertension     There are no active problems to display for this patient.   Past Surgical History:  Procedure Laterality Date  . APPENDECTOMY     when he was 15   . HERNIA REPAIR          Home Medications    Prior to Admission medications   Medication Sig Start Date End Date Taking? Authorizing Provider  ondansetron (ZOFRAN ODT) 8 MG disintegrating tablet Take 1 tablet (8 mg total) by mouth every 8 (eight) hours as needed for nausea or vomiting. 11/10/16   Cathren LaineSteinl, Kevin, MD    Family History History reviewed. No pertinent family history.  Social History Social History   Tobacco Use  . Smoking status: Former Games developermoker  . Smokeless tobacco: Never Used  Substance Use Topics  . Alcohol use: No  . Drug use: No     Allergies   Patient has no known allergies.   Review of Systems Review of Systems  All other systems reviewed and are negative.    Physical Exam Updated Vital Signs BP (!) 151/78   Pulse 69   Temp 98.1 F (36.7 C)   Resp 18   SpO2 98%   Physical Exam Vitals signs and nursing note reviewed.    82 year old male, resting comfortably and in no acute distress. Vital signs are  significant for elevated systolic blood pressure. Oxygen saturation is 98%, which is normal. Head is normocephalic.  Abrasion and hematoma present right frontal and frontoparietal area.Marland Kitchen. PERRLA, EOMI. Oropharynx is clear. Neck is nontender without adenopathy or JVD. Back is nontender and there is no CVA tenderness. Lungs are clear without rales, wheezes, or rhonchi. Chest is moderately tender in the right lateral rib cage.  There is no crepitus. Heart has regular rate and rhythm without murmur. Abdomen is soft, flat, nontender without masses or hepatosplenomegaly and peristalsis is normoactive. Extremities have no cyanosis or edema, full range of motion is present. Skin is warm and dry without rash. Neurologic: Mental status is normal, cranial nerves are intact, there are no motor or sensory deficits.  ED Treatments / Results   Radiology Dg Chest 2 View  Result Date: 01/04/2018 CLINICAL DATA:  Recent fall with right-sided chest pain, initial encounter EXAM: CHEST - 2 VIEW COMPARISON:  07/10/2017 FINDINGS: Cardiac shadow is stable. Hiatal hernia is again noted. The lungs are clear bilaterally. No acute bony abnormality is noted. IMPRESSION: Stable hiatal hernia. No acute bony abnormality noted. Electronically Signed   By: Alcide CleverMark  Lukens M.D.   On: 01/04/2018 00:34   Ct Head Wo Contrast  Result Date: 01/04/2018 CLINICAL DATA:  Status post fall, with right-sided head injury. Concern for cervical spine injury. Initial encounter. EXAM: CT HEAD WITHOUT CONTRAST CT CERVICAL SPINE WITHOUT CONTRAST TECHNIQUE: Multidetector CT imaging of the head and cervical spine was performed following the standard protocol without intravenous contrast. Multiplanar CT image reconstructions of the cervical spine were also generated. COMPARISON:  CT of the head performed 11/10/2017, and CT of the cervical spine performed 11/05/2016 FINDINGS: CT HEAD FINDINGS Brain: No evidence of acute infarction, hemorrhage,  hydrocephalus, extra-axial collection or mass lesion / mass effect. Prominence of the sulci suggests mild cortical volume loss. Chronic encephalomalacia is noted at the frontal lobes bilaterally. The brainstem and fourth ventricle are within normal limits. The basal ganglia are unremarkable in appearance. No mass effect or midline shift is seen. Vascular: No hyperdense vessel or unexpected calcification. Skull: There is no evidence of fracture; visualized osseous structures are unremarkable in appearance. Sinuses/Orbits: The orbits are within normal limits. The paranasal sinuses and mastoid air cells are well-aerated. Other: Soft tissue swelling is noted overlying the right frontal calvarium. CT CERVICAL SPINE FINDINGS Alignment: Normal. Skull base and vertebrae: No acute fracture. No primary bone lesion or focal pathologic process. Soft tissues and spinal canal: No prevertebral fluid or swelling. No visible canal hematoma. Disc levels: Mild intervertebral disc space narrowing is noted at C5-C6, with anterior and posterior disc osteophyte complexes. Upper chest: The visualized lung apices are clear. The thyroid gland is unremarkable in appearance. Other: No additional soft tissue abnormalities are seen. IMPRESSION: 1. No evidence of traumatic intracranial injury or fracture. 2. No evidence of fracture or subluxation along the cervical spine. 3. Soft tissue swelling overlying the right frontal calvarium. 4. Mild cortical volume loss. Chronic encephalomalacia at the frontal lobes bilaterally. Electronically Signed   By: Roanna Raider M.D.   On: 01/04/2018 05:18   Ct Cervical Spine Wo Contrast  Result Date: 01/04/2018 CLINICAL DATA:  Status post fall, with right-sided head injury. Concern for cervical spine injury. Initial encounter. EXAM: CT HEAD WITHOUT CONTRAST CT CERVICAL SPINE WITHOUT CONTRAST TECHNIQUE: Multidetector CT imaging of the head and cervical spine was performed following the standard protocol  without intravenous contrast. Multiplanar CT image reconstructions of the cervical spine were also generated. COMPARISON:  CT of the head performed 11/10/2017, and CT of the cervical spine performed 11/05/2016 FINDINGS: CT HEAD FINDINGS Brain: No evidence of acute infarction, hemorrhage, hydrocephalus, extra-axial collection or mass lesion / mass effect. Prominence of the sulci suggests mild cortical volume loss. Chronic encephalomalacia is noted at the frontal lobes bilaterally. The brainstem and fourth ventricle are within normal limits. The basal ganglia are unremarkable in appearance. No mass effect or midline shift is seen. Vascular: No hyperdense vessel or unexpected calcification. Skull: There is no evidence of fracture; visualized osseous structures are unremarkable in appearance. Sinuses/Orbits: The orbits are within normal limits. The paranasal sinuses and mastoid air cells are well-aerated. Other: Soft tissue swelling is noted overlying the right frontal calvarium. CT CERVICAL SPINE FINDINGS Alignment: Normal. Skull base and vertebrae: No acute fracture. No primary bone lesion or focal pathologic process. Soft tissues and spinal canal: No prevertebral fluid or swelling. No visible canal hematoma. Disc levels: Mild intervertebral disc space narrowing is noted at C5-C6, with anterior and posterior disc osteophyte complexes. Upper chest: The visualized lung apices are clear. The thyroid gland is unremarkable in appearance. Other: No additional soft tissue abnormalities are seen. IMPRESSION: 1. No evidence of traumatic intracranial injury or fracture. 2. No evidence of fracture or  subluxation along the cervical spine. 3. Soft tissue swelling overlying the right frontal calvarium. 4. Mild cortical volume loss. Chronic encephalomalacia at the frontal lobes bilaterally. Electronically Signed   By: Roanna RaiderJeffery  Chang M.D.   On: 01/04/2018 05:18    Procedures Procedures   Medications Ordered in ED Medications -  No data to display   Initial Impression / Assessment and Plan / ED Course  I have reviewed the triage vital signs and the nursing notes.  Pertinent imaging results that were available during my care of the patient were reviewed by me and considered in my medical decision making (see chart for details).  Fall with injury to the right side of head and right side of chest.  Chest x-ray shows no evidence of rib fracture or pneumothorax.  Old records are reviewed and it is noted he was hospitalized for a traumatic subdural hematoma 1 year ago.  He is being sent for CT of head and cervical spine.  CT scans show no acute injury.  Patient is advised of these findings.  Advised to apply ice, use over-the-counter analgesics as needed for pain.  Return precautions discussed.  Final Clinical Impressions(s) / ED Diagnoses   Final diagnoses:  Fall from slip, trip, or stumble, initial encounter  Contusion of scalp, initial encounter  Abrasion of scalp, initial encounter  Chest wall contusion, right, initial encounter    ED Discharge Orders    None       Dione BoozeGlick, Brayant Dorr, MD 01/04/18 [phone]

## 2018-11-17 IMAGING — CT CT HEAD W/O CM
4 of 7 series · 15 of 47 positions shown, 16 images · non-contrast
Comparison: Head CT from [REDACTED] 09/02/2016

ADDENDUM:
Please note that the comparison head CT was performed 11/02/2016 (as
opposed to 09/02/2016 as stated in the comparison section).
CLINICAL DATA: Headache since falling on [REDACTED] when patient had
intracranial hemorrhage. Discharge from [facility] yesterday. Nausea
and headache.

EXAM:
CT HEAD WITHOUT CONTRAST
CT CERVICAL SPINE WITHOUT CONTRAST
TECHNIQUE: Multidetector CT imaging of the head and cervical spine was
performed following the standard protocol without intravenous
contrast. Multiplanar CT image reconstructions of the cervical spine
were also generated.

[Series 4: head wo · axial · 0.47mm/px · z∈[-90,-35]mm · 2 of 34 slices shown, 3 images]
[im 12/34  brain]
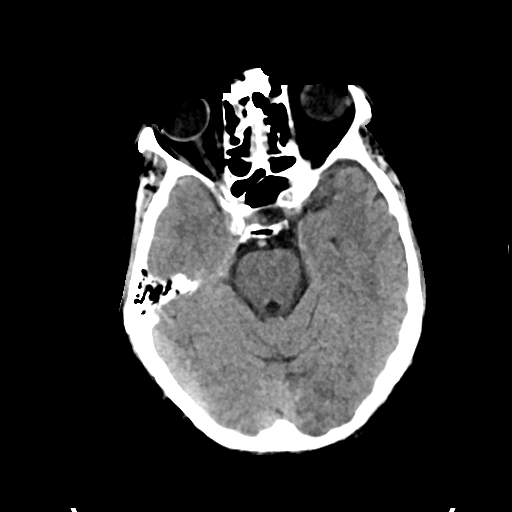
[im 12/34  bone]
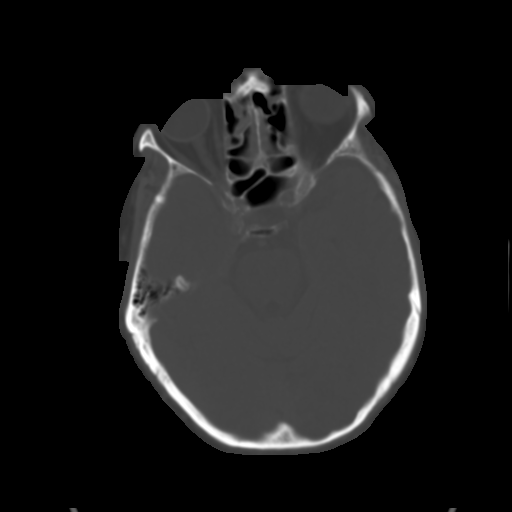
[im 23/34  brain]
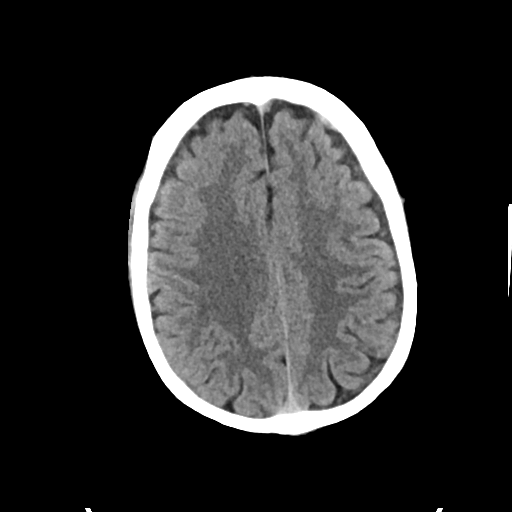

[Series 5: head bone · axial · 0.47mm/px · z∈[-131,+5]mm · 8 of 84 slices shown]
[im 8/84  bone]
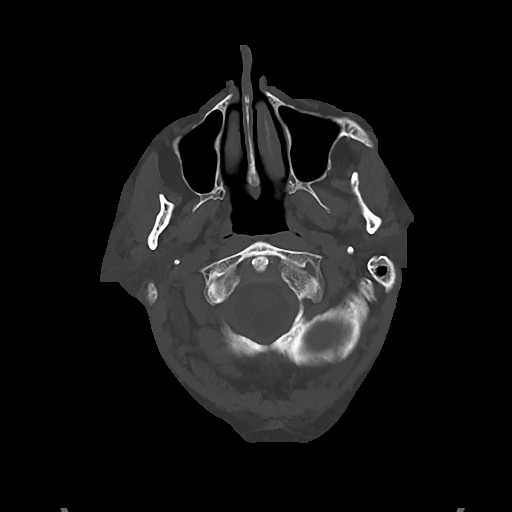
[im 16/84  bone]
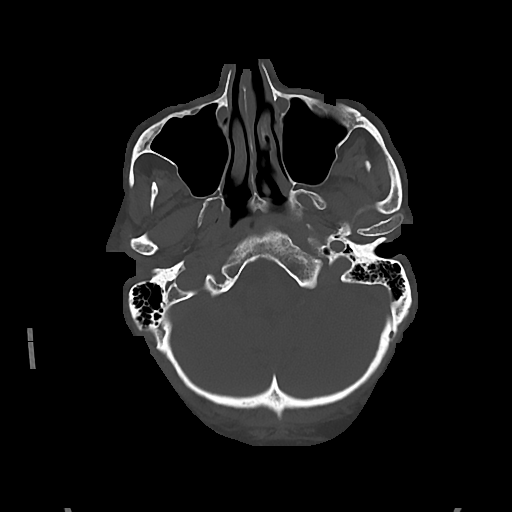
[im 31/84  bone]
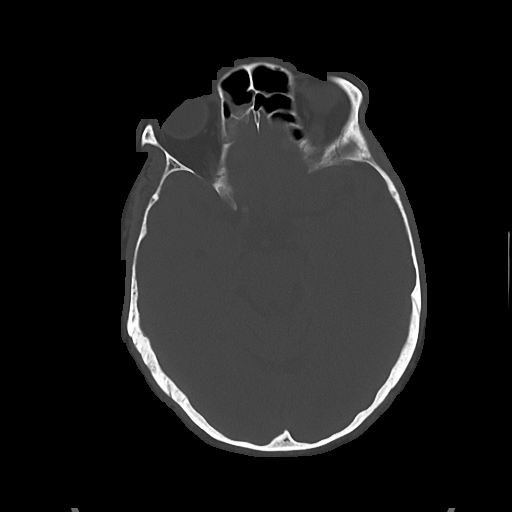
[im 38/84  bone]
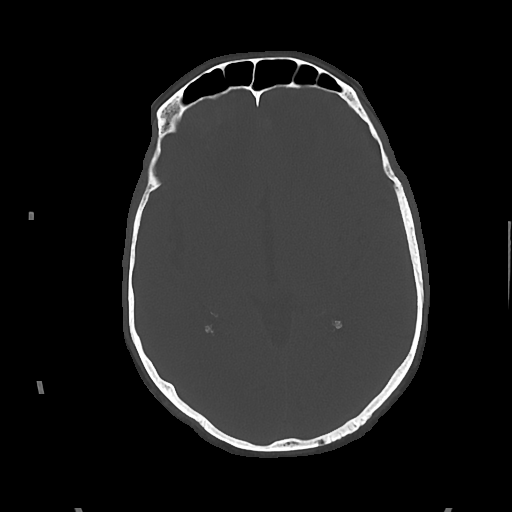
[im 46/84  bone]
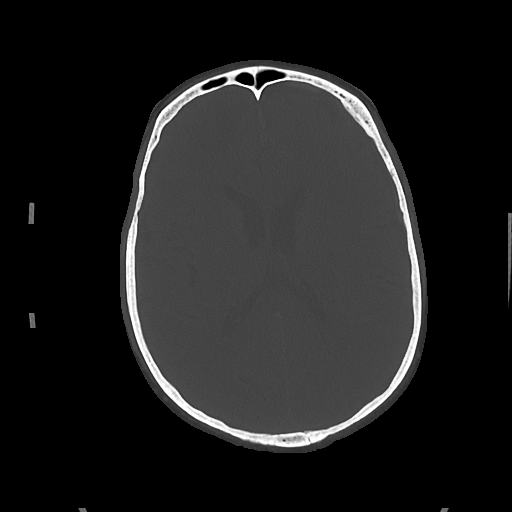
[im 53/84  bone]
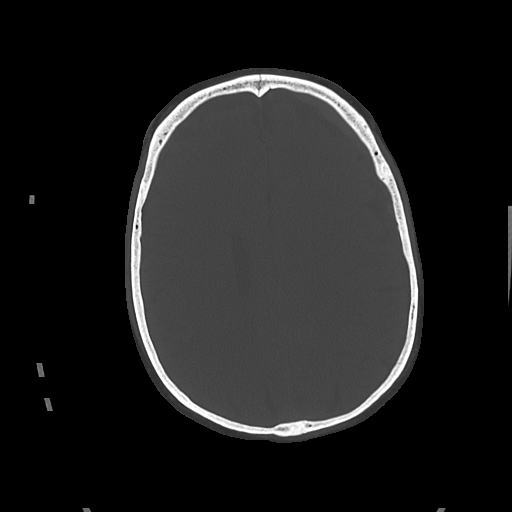
[im 68/84  bone]
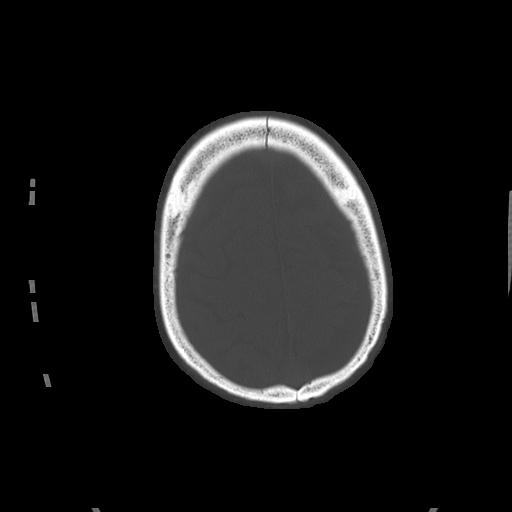
[im 76/84  bone]
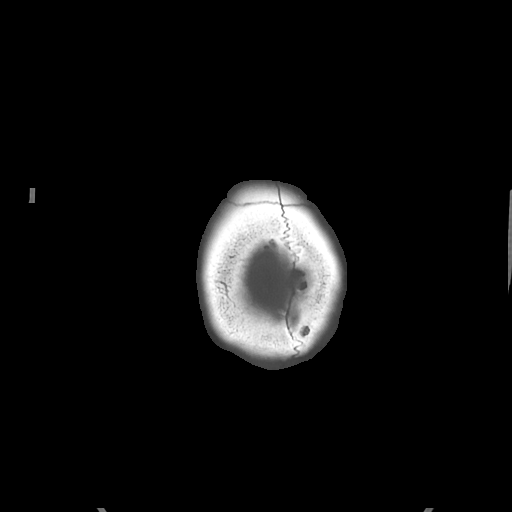

[Series 6: cor soft · coronal · 0.38mm/px · 3 of 71 slices shown]
[im 27/71  brain]
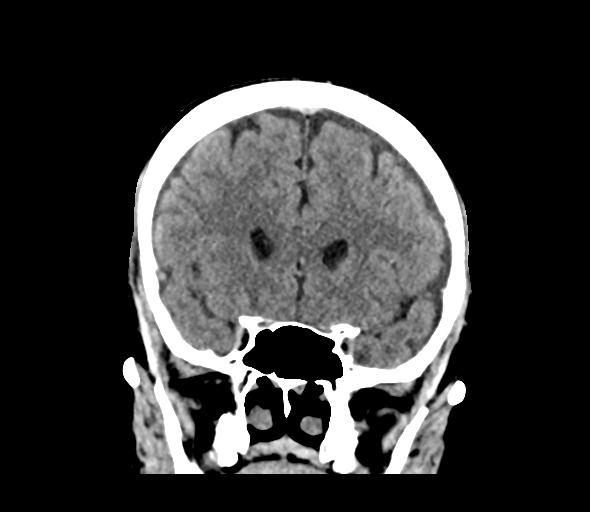
[im 36/71  brain]
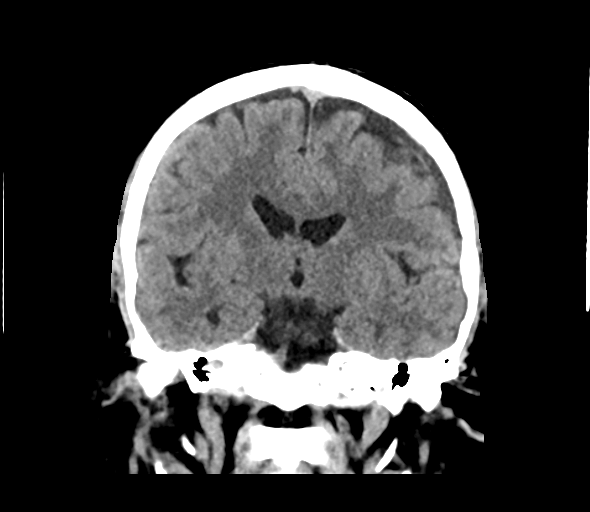
[im 44/71  brain]
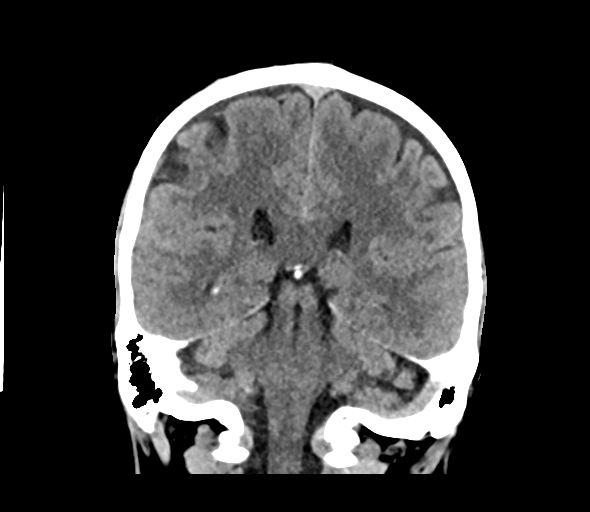

[Series 7: sag soft · sagittal · 0.33mm/px · 2 of 67 slices shown]
[im 23/67  brain]
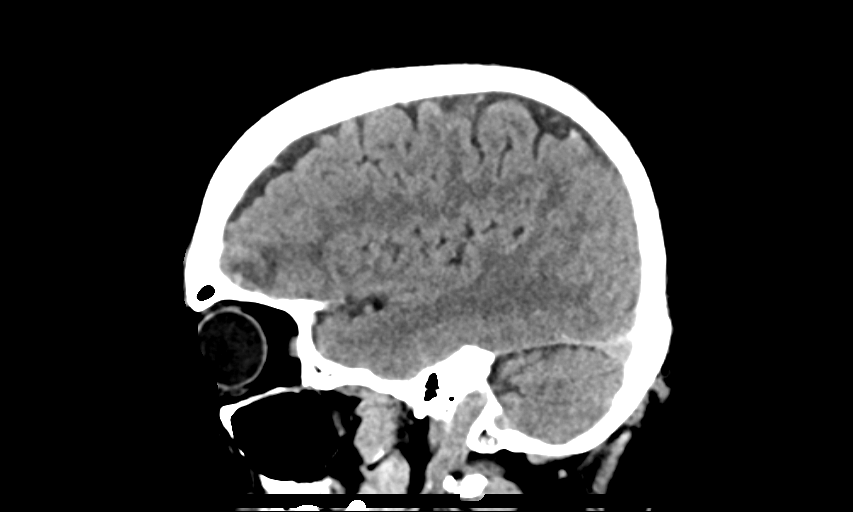
[im 45/67  brain]
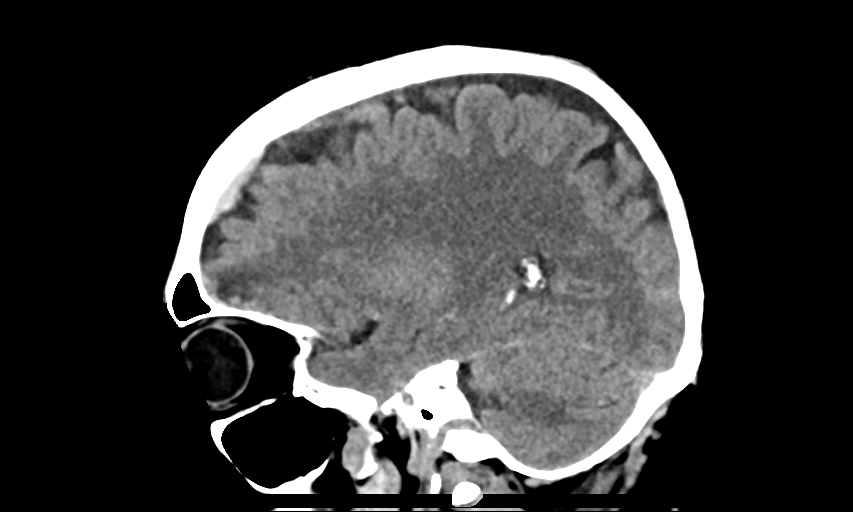

[15 of 47 positions shown; findings below may reference images not displayed]

FINDINGS: CT HEAD FINDINGS

Brain: Bilateral inferior frontal hemorrhagic contusions. As
expected, edema increased from comparison study but hemorrhage is
nonprogressive. Bilateral subdural hematomas along the frontal
convexities, stable to decreased from prior. Left-sided subdural
hematoma measures up to 5 mm. No subdural mass-effect. Possible
subarachnoid hemorrhage along the mid falx, stable. Small volume
anterior left frontal subarachnoid hemorrhage was also seen
previously. Small volume right posterior frontal subarachnoid
hemorrhage that is newly seen. Probable contrecoup nonhemorrhagic
contusion in the left occipital pole. No infarct, hydrocephalus, or
shift.

Vascular: No acute finding

Skull: Known frontal bone fracture and diastatic upper sagittal
suture. No depressed fracture.

Sinuses/Orbits: No acute finding.  Bilateral cataract resection.

CT CERVICAL SPINE FINDINGS

Alignment: No traumatic malalignment.

Skull base and vertebrae: Negative for fracture

Soft tissues and spinal canal: No prevertebral fluid or swelling. No
visible canal hematoma.

Disc levels:  No evidence of cord impingement

Upper chest: No acute finding
IMPRESSION: 1. Known bilateral inferior frontal hemorrhagic contusion. As
expected, swelling is increased from 11/02/2016, but no progressive
hemorrhage.
2. Bifrontal subdural hematoma that is stable to decreased. No
associated mass effect.
3. Interval trace subarachnoid hemorrhage along the right parietal
convexity. If no interval injury this may reflect re- distributed
blood.
4. Suspect mild nonhemorrhagic contrecoup contusion in the left
occipital pole.
5. Known nondepressed frontal bone fracture. Sagittal suture
diastasis.
6. No cervical spine fracture.
